# Patient Record
Sex: Male | Born: 1995 | Race: White | Hispanic: No | Marital: Single | Smoking: Never smoker
Health system: Southern US, Community
[De-identification: ages and names within clinical notes are randomized; demographics above are authoritative.]

## PROBLEM LIST (undated history)

## (undated) DIAGNOSIS — E669 Obesity, unspecified: Secondary | ICD-10-CM

---
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
## 2017-12-27 ENCOUNTER — Emergency Department (HOSPITAL_COMMUNITY)
Admission: EM | Admit: 2017-12-27 | Discharge: 2017-12-27 | Disposition: A | Discharge status: Home / Self Care | Payer: BLUE CROSS/BLUE SHIELD | Attending: Emergency Medicine | Admitting: Emergency Medicine

## 2017-12-27 ENCOUNTER — Emergency Department (HOSPITAL_COMMUNITY): Payer: BLUE CROSS/BLUE SHIELD

## 2017-12-27 ENCOUNTER — Encounter (HOSPITAL_COMMUNITY): Payer: Self-pay | Admitting: *Deleted

## 2017-12-27 DIAGNOSIS — M549 Dorsalgia, unspecified: Secondary | ICD-10-CM | POA: Diagnosis present

## 2017-12-27 DIAGNOSIS — R809 Proteinuria, unspecified: Secondary | ICD-10-CM

## 2017-12-27 DIAGNOSIS — M5440 Lumbago with sciatica, unspecified side: Secondary | ICD-10-CM | POA: Insufficient documentation

## 2017-12-27 DIAGNOSIS — Z79899 Other long term (current) drug therapy: Secondary | ICD-10-CM | POA: Diagnosis not present

## 2017-12-27 DIAGNOSIS — M545 Low back pain: Secondary | ICD-10-CM

## 2017-12-27 HISTORY — DX: Obesity, unspecified: E66.9

## 2017-12-27 LAB — URINALYSIS, ROUTINE W REFLEX MICROSCOPIC
BILIRUBIN URINE: NEGATIVE
GLUCOSE, UA: NEGATIVE mg/dL
Hgb urine dipstick: NEGATIVE
KETONES UR: 15 mg/dL — AB
Leukocytes, UA: NEGATIVE
NITRITE: NEGATIVE
PH: 7.5 (ref 5.0–8.0)
Protein, ur: >300 mg/dL — AB
Specific Gravity, Urine: 1.015 (ref 1.005–1.030)

## 2017-12-27 LAB — URINALYSIS, MICROSCOPIC (REFLEX): RBC / HPF: NONE SEEN RBC/hpf (ref 0–5)

## 2017-12-27 MED ORDER — CYCLOBENZAPRINE HCL 10 MG PO TABS
5.0000 mg | ORAL_TABLET | Freq: Once | ORAL | Status: AC
  Administered 2017-12-27: 5 mg via ORAL
  Filled 2017-12-27: qty 1

## 2017-12-27 MED ORDER — TRAMADOL HCL 50 MG PO TABS
50.0000 mg | ORAL_TABLET | Freq: Once | ORAL | Status: AC
  Administered 2017-12-27: 50 mg via ORAL
  Filled 2017-12-27: qty 1

## 2017-12-27 MED ORDER — IBUPROFEN 600 MG PO TABS: 600.0000 mg | ORAL_TABLET | Freq: Four times a day (QID) | ORAL | 0 refills | Status: AC | PRN

## 2017-12-27 MED ORDER — IBUPROFEN 400 MG PO TABS
400.0000 mg | ORAL_TABLET | Freq: Once | ORAL | Status: AC
  Administered 2017-12-27: 400 mg via ORAL
  Filled 2017-12-27: qty 1

## 2017-12-27 MED ORDER — CYCLOBENZAPRINE HCL 10 MG PO TABS: 10.0000 mg | ORAL_TABLET | Freq: Two times a day (BID) | ORAL | 0 refills | Status: AC | PRN

## 2017-12-27 NOTE — ED Notes (Signed)
Pt reports lifting something heavy today during a job fitness test and hurting his back. Pt is ambulatory at this time.

## 2017-12-27 NOTE — ED Triage Notes (Signed)
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  Pt has severe back spasms, pt appears pale and reports that he is nauseated.  Pt back "popped" and then the pain began.  No distal neuralgia

## 2017-12-27 NOTE — Discharge Instructions (Signed)
You have been evaluated for your back pain.  This is likely due to a back strain or a potential herniated disc.  You will benefit from a non emergent MRI of your lumbar spine.  Your urine shows a moderate amount of protein.  Please have it recheck next week by a primary care provider.  Take ibuprofen and muscle relaxant as needed.  Return if you have any concerns.

## 2017-12-27 NOTE — ED Provider Notes (Signed)
[facility] EMERGENCY DEPARTMENT Provider Note   CSN: [id number] Arrival date & time: 12/27/17  1000     History   Chief Complaint Chief Complaint  Patient presents with  . Back Pain    HPI Jeffery Olson is a 22 y.o. male.  The history is provided by the patient. No language interpreter was used.  Back Pain   Pertinent negatives include no fever and no numbness.     22 year old obese male brought here via EMS from occupational health for evaluation of back pain.  Patient report he has had trouble with his back in the past from heavy lifting.  Today, he was performing physical examination for a job and while he was performing a left of a stationary object while bending over with his knee locked he developed acute onset of sharp nonradiating pain to his low back and a "pop" sensation.  The pain initially intense, patient felt dizzy, lightheadedness, and nauseous.  He was able to ambulate afterward, he did receive some ibuprofen and was brought here for further evaluation.  While resting his pain did improve to 7 out of 10.  He denies any associated fever, chills, abdominal pain, dysuria, bowel bladder incontinence or saddle anesthesia.  Pain is localized to his back and does not radiate down his leg.  No urinary symptom and no bloody urine.  Past Medical History:  Diagnosis Date  . Obesity     There are no active problems to display for this patient.   History reviewed. No pertinent surgical history.      Home Medications    Prior to Admission medications   Medication Sig Start Date End Date Taking? Authorizing Provider  diphenhydrAMINE (BENADRYL) 25 MG tablet Take 25 mg by mouth as needed for allergies.   Yes [provider]    Family History No family history on file.  Social History Social History   Tobacco Use  . Smoking status: Never Smoker  . Smokeless tobacco: Never Used  Substance Use Topics  . Alcohol use: No  . Drug use: No       Allergies   Sulfa antibiotics   Review of Systems Review of Systems  Constitutional: Negative for fever.  Musculoskeletal: Positive for back pain.  Neurological: Negative for numbness.     Physical Exam Updated Vital Signs BP (!) 156/91 (BP Location: Right Arm)   Pulse (!) 103   Temp 97.7 F (36.5 C) (Oral)   Resp 18   Ht 5\' 9"  (1.753 m)   Wt 124.7 kg   SpO2 98%   BMI 40.61 kg/m   Physical Exam  Constitutional: He appears well-developed and well-nourished. No distress.  HENT:  Head: Atraumatic.  Eyes: Conjunctivae are normal.  Neck: Neck supple.  Abdominal: Soft. Bowel sounds are normal.  Musculoskeletal: He exhibits tenderness (Tenderness to midline lumbar spine at the level of L2-L3 without crepitus or step-off.  Negative straight leg raise).  4 out of 5 strength to right lower extremity, 5 out of 5 strength to left lower extremity intact distal pedal pulse.  Neurological: He is alert.  Skin: No rash noted.  Psychiatric: He has a normal mood and affect.  Nursing note and vitals reviewed.    ED Treatments / Results  Labs (all labs ordered are listed, but only abnormal results are displayed) Labs Reviewed  URINALYSIS, ROUTINE W REFLEX MICROSCOPIC - Abnormal; Notable for the following components:      Result Value   APPearance CLOUDY (*)  Ketones, ur 15 (*)    Protein, ur >300 (*)    All other components within normal limits  URINALYSIS, MICROSCOPIC (REFLEX) - Abnormal; Notable for the following components:   Bacteria, UA RARE (*)    All other components within normal limits    EKG None  Radiology Dg Lumbar Spine Complete  Result Date: 12/27/2017 CLINICAL DATA:  Back pain EXAM: LUMBAR SPINE - COMPLETE 4+ VIEW COMPARISON:  None. FINDINGS: Five lumbar type vertebral bodies are well visualized. Schmorl's nodes are noted superiorly at L1 and L2. There is compression deformity with bowing of the anterior wall of L3. Given the patient's clinical  history this may be acute in nature. Some mild height loss at L4 and L5 are also seen. No pars defects or anterolisthesis is identified. No soft tissue abnormality is noted. IMPRESSION: Multilevel vertebral height loss particularly at L3. Given the recent history this may be acute in nature. Nonemergent MRI would be helpful for further evaluation. Electronically Signed   By: Alcide Clever M.D.   On: 12/27/2017 13:47    Procedures Procedures (including critical care time)  Medications Ordered in ED Medications  ibuprofen (ADVIL,MOTRIN) tablet 400 mg (400 mg Oral Given 12/27/17 1114)     Initial Impression / Assessment and Plan / ED Course  I have reviewed the triage vital signs and the nursing notes.  Pertinent labs & imaging results that were available during my care of the patient were reviewed by me and considered in my medical decision making (see chart for details).     BP (!) 157/86 (BP Location: Right Arm)   Pulse 84   Temp 97.7 F (36.5 C) (Oral)   Resp 16   Ht 5\' 9"  (1.753 m)   Wt 124.7 kg   SpO2 97%   BMI 40.61 kg/m    Final Clinical Impressions(s) / ED Diagnoses   Final diagnoses:  Acute bilateral low back pain, with sciatica presence unspecified  Proteinuria, unspecified type    ED Discharge Orders         Ordered    cyclobenzaprine (FLEXERIL) 10 MG tablet  2 times daily PRN     12/27/17 1526    ibuprofen (ADVIL,MOTRIN) 600 MG tablet  Every 6 hours PRN     12/27/17 1526         1:09 PM Patient here with acute onset of low back pain after heavy lifting today.  He does not have any symptoms concerning for cauda equina.  Symptoms not infectious in etiology.  X-ray ordered, pain medication and muscle relaxant ordered.  3:30 PM Urine shows 15 ketones and greater than 300 protein with cloudy appearance but no blood in urine.  This is nonspecific but not related to patient's current complaint.  However, he will need to have his urine rechecked and as well as  blood work to assess for potential kidney disease.  L-spine x-ray obtained showing multilevel vertebral height loss particularly at L3.  This is the patient's location of pain.  Given the recent history this may be acute in nature, nonemergent MRI would be helpful for further evaluation.  I discussed this finding with patient.  I strongly encourage patient to follow-up with primary care provider and get an MRI as recommended.  I do not think he has any cauda equina symptoms.  I believe he is stable for discharge.  His symptoms did improve with symptom medic treatment and he is able to ambulate.  Rice therapy discussed.  Return precautions discussed.  Fayrene Helperran, Meshach Perry, PA-C 12/27/17 1532    Mesner, Barbara CowerJason, MD 12/28/17 [phone]

## 2019-04-02 ENCOUNTER — Ambulatory Visit: Payer: Managed Care, Other (non HMO) | Attending: Internal Medicine

## 2019-04-02 ENCOUNTER — Other Ambulatory Visit: Payer: Self-pay

## 2019-04-02 DIAGNOSIS — Z20822 Contact with and (suspected) exposure to covid-19: Secondary | ICD-10-CM

## 2019-04-04 LAB — NOVEL CORONAVIRUS, NAA: SARS-CoV-2, NAA: NOT DETECTED

## 2019-12-22 IMAGING — CR DG LUMBAR SPINE COMPLETE 4+V
5 series · 5 of 5 positions shown · non-contrast
Comparison: None.

CLINICAL DATA: Back pain

EXAM:
LUMBAR SPINE - COMPLETE 4+ VIEW

[l-spine ap]
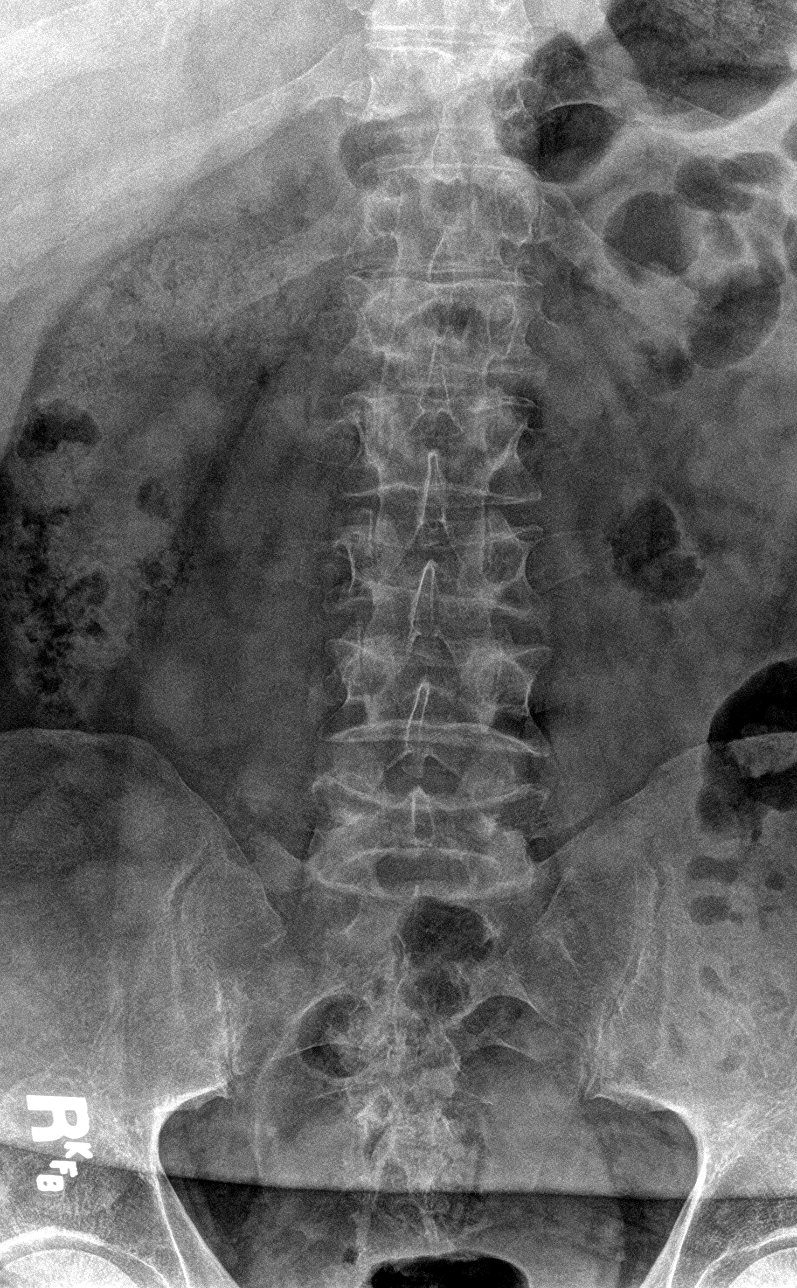

[l-spine obl (1 of 2)]
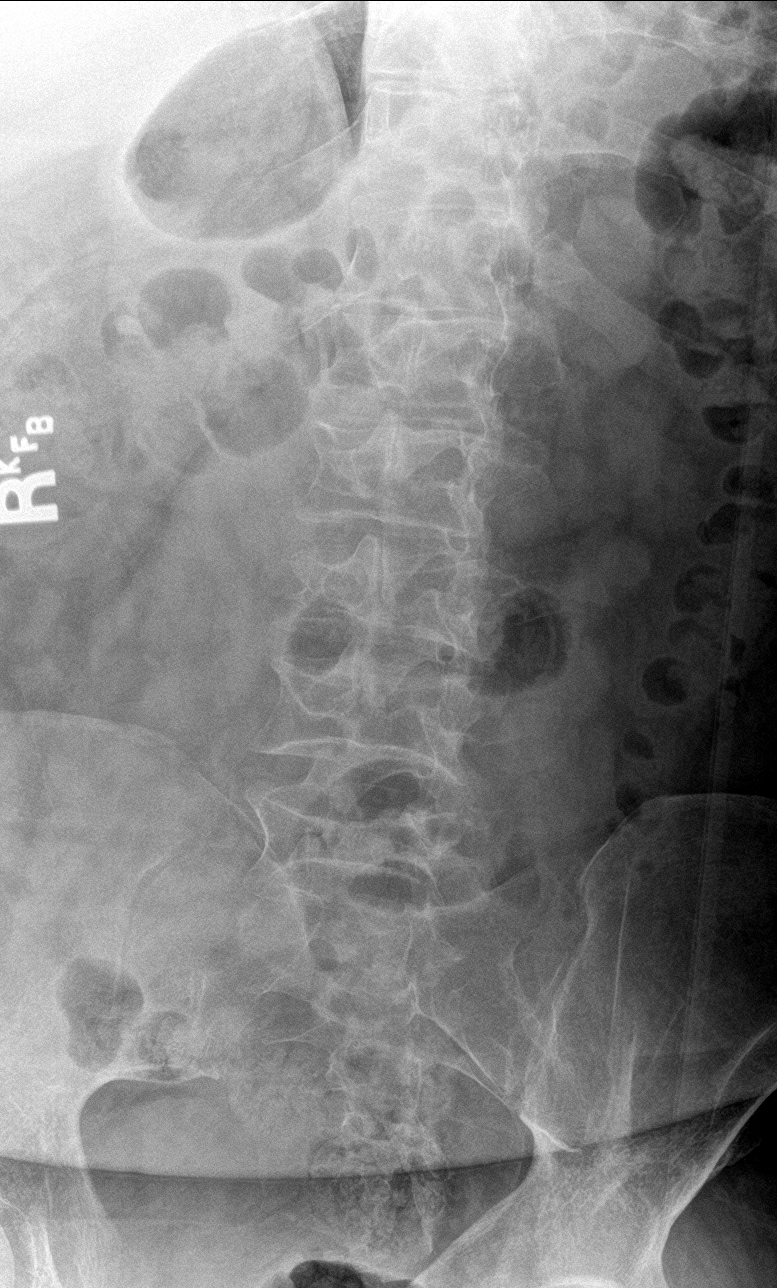

[l-spine obl (2 of 2)]
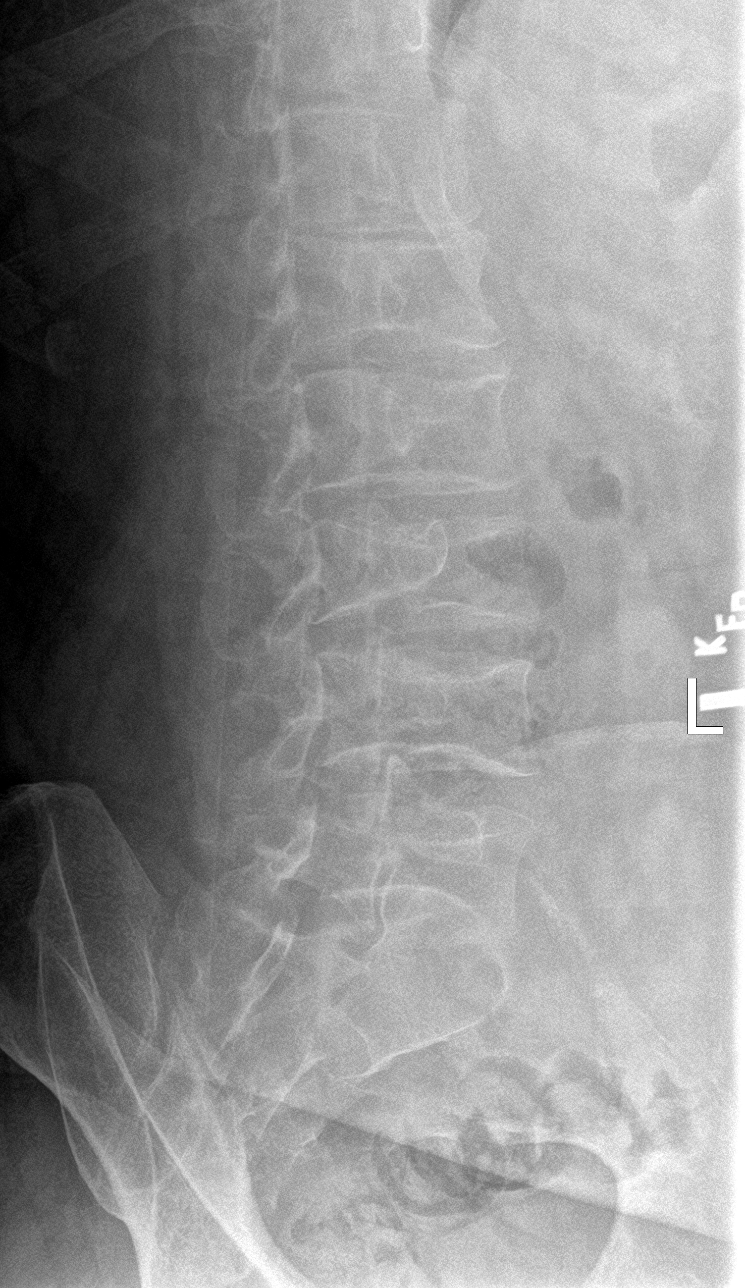

[l-spine lat]
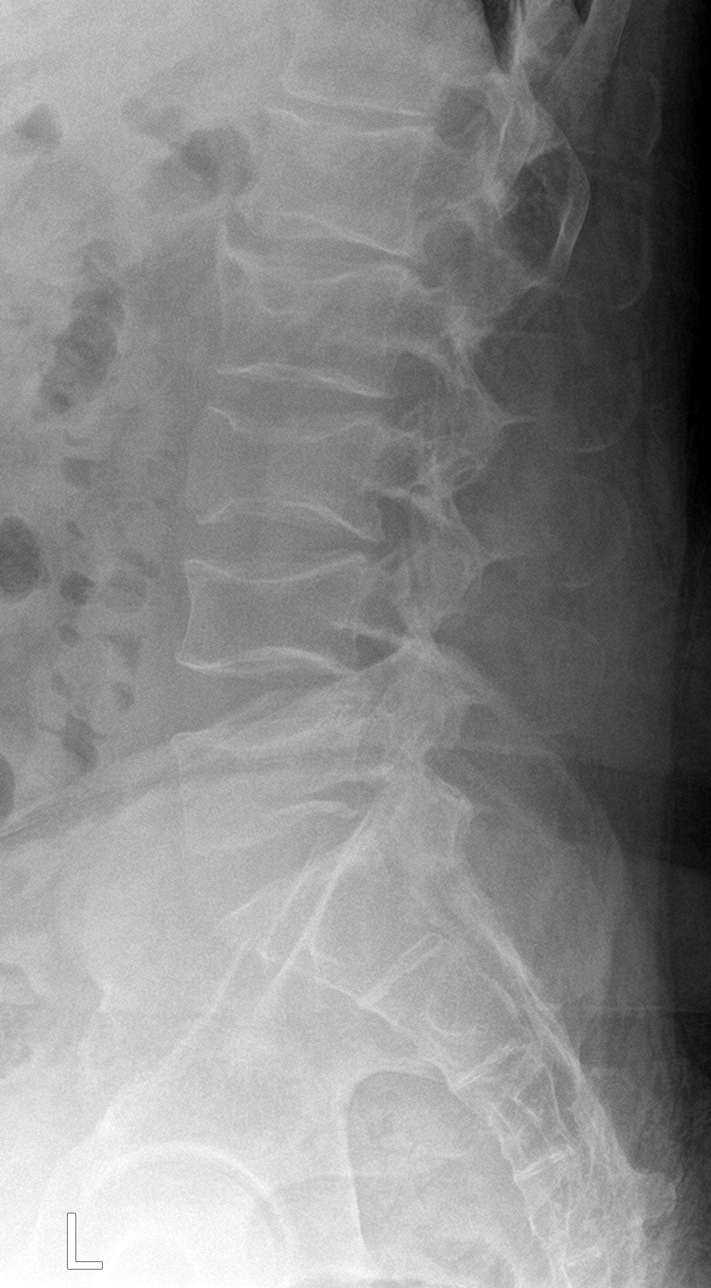

[l-spine spot]
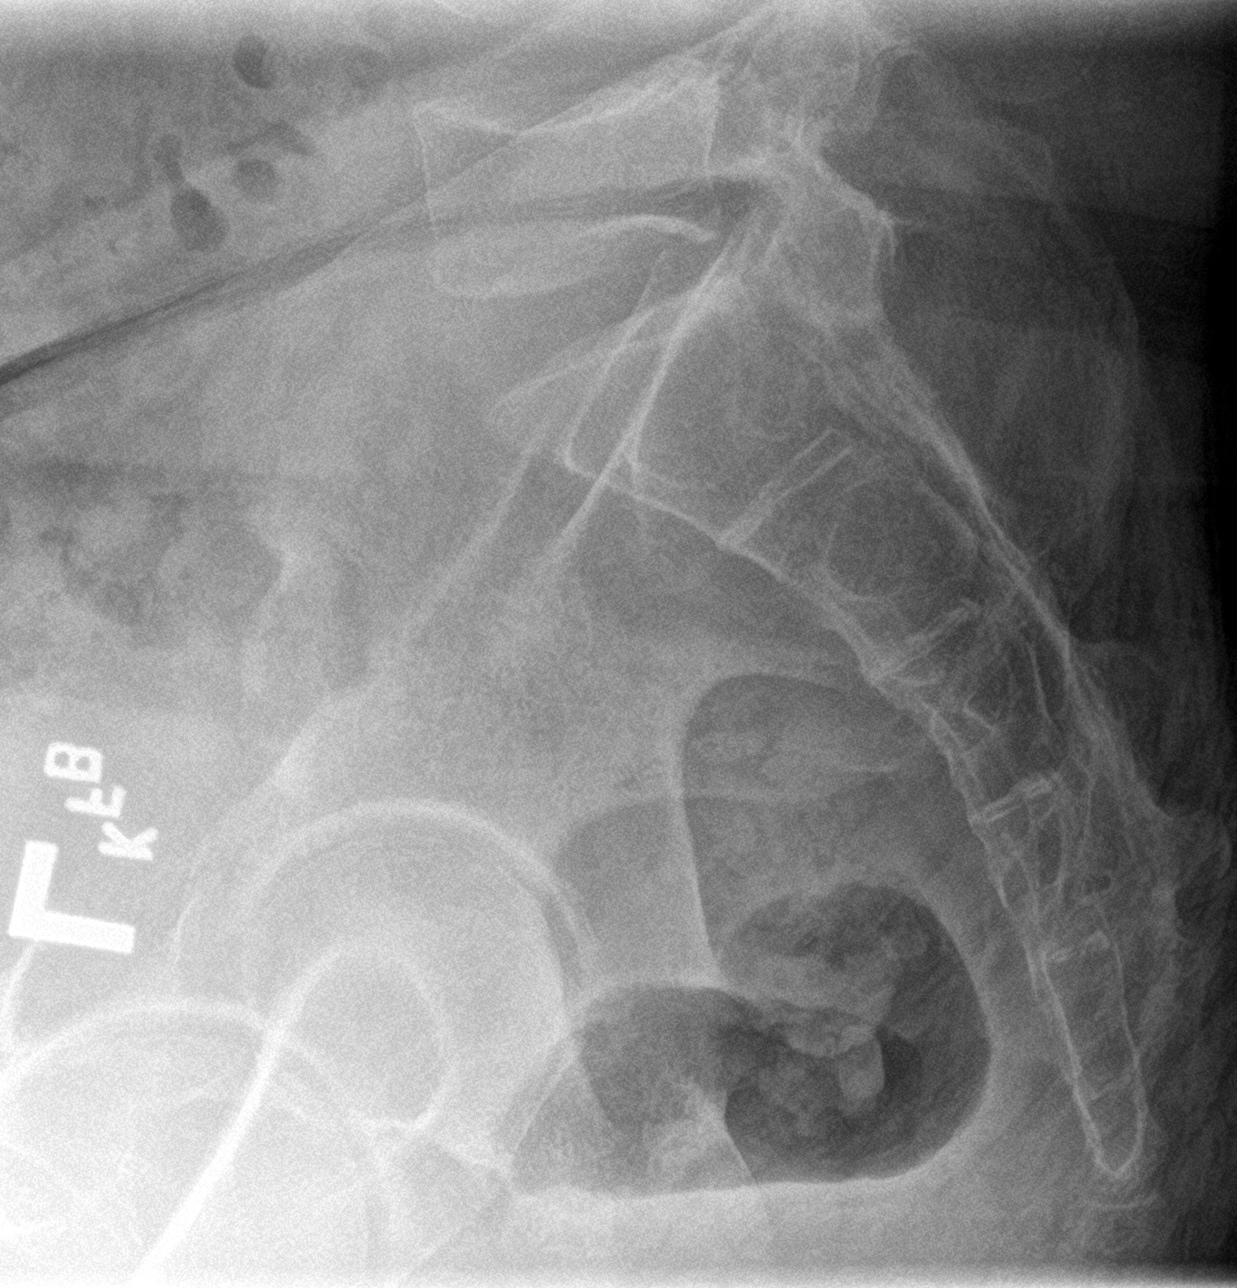

[5 of 5 positions shown; findings below may reference images not displayed]

FINDINGS: Five lumbar type vertebral bodies are well visualized. Schmorl's
nodes are noted superiorly at L1 and L2. There is compression
deformity with bowing of the anterior wall of L3. Given the
patient's clinical history this may be acute in nature. Some mild
height loss at L4 and L5 are also seen. No pars defects or
anterolisthesis is identified. No soft tissue abnormality is noted.
IMPRESSION: Multilevel vertebral height loss particularly at L3. Given the
recent history this may be acute in nature. Nonemergent MRI would be
helpful for further evaluation.
